# Patient Record
Sex: Female | Born: 1965 | Race: White | Hispanic: No | Marital: Married | Smoking: Former smoker
Health system: Southern US, Community
[De-identification: ages and names within clinical notes are randomized; demographics above are authoritative.]

## PROBLEM LIST (undated history)

## (undated) DIAGNOSIS — C801 Malignant (primary) neoplasm, unspecified: Secondary | ICD-10-CM

---

## 2005-01-16 ENCOUNTER — Observation Stay: Payer: Self-pay | Admitting: Unknown Physician Specialty

## 2005-03-24 ENCOUNTER — Inpatient Hospital Stay: Payer: Self-pay | Admitting: Obstetrics & Gynecology

## 2005-06-08 ENCOUNTER — Ambulatory Visit: Payer: Self-pay | Admitting: Obstetrics & Gynecology

## 2009-06-14 ENCOUNTER — Ambulatory Visit: Payer: Self-pay

## 2011-12-07 ENCOUNTER — Ambulatory Visit: Payer: Self-pay | Admitting: Obstetrics and Gynecology

## 2012-02-08 ENCOUNTER — Ambulatory Visit: Payer: Self-pay | Admitting: Obstetrics and Gynecology

## 2012-02-08 LAB — COMPREHENSIVE METABOLIC PANEL
Alkaline Phosphatase: 60 U/L (ref 50–136)
Anion Gap: 8 (ref 7–16)
Bilirubin,Total: 0.4 mg/dL (ref 0.2–1.0)
Chloride: 108 mmol/L — ABNORMAL HIGH (ref 98–107)
Co2: 27 mmol/L (ref 21–32)
Creatinine: 0.85 mg/dL (ref 0.60–1.30)
EGFR (African American): >60
EGFR (Non-African Amer.): >60
Glucose: 74 mg/dL (ref 65–99)
Osmolality: 284 (ref 275–301)
SGPT (ALT): 21 U/L

## 2012-02-08 LAB — CBC
MCH: 29.2 pg (ref 26.0–34.0)
MCV: 86 fL (ref 80–100)
Platelet: 295 10*3/uL (ref 150–440)
RBC: 4.12 10*6/uL (ref 3.80–5.20)
RDW: 13.7 % (ref 11.5–14.5)
WBC: 6.3 10*3/uL (ref 3.6–11.0)

## 2012-02-13 ENCOUNTER — Ambulatory Visit: Payer: Self-pay | Admitting: Obstetrics and Gynecology

## 2012-02-13 HISTORY — PX: TOTAL LAPAROSCOPIC HYSTERECTOMY WITH SALPINGECTOMY: SHX6742

## 2012-02-13 LAB — PREGNANCY, URINE: Pregnancy Test, Urine: NEGATIVE m[IU]/mL

## 2012-02-16 LAB — PATHOLOGY REPORT

## 2015-03-28 NOTE — Op Note (Signed)
PATIENT NAME:  Denise [name] MR#:  [id number] DATE OF BIRTH:  09/09/1966  DATE OF PROCEDURE:  02/13/2012  PREOPERATIVE DIAGNOSES:  1. Menorrhagia.  2. Severe dysmenorrhea.  3. Suspected adenomyosis.   POSTOPERATIVE DIAGNOSES:  1. Menorrhagia.  2. Severe dysmenorrhea.  3. Suspected adenomyosis.   PROCEDURES:  1. Total laparoscopic hysterectomy.  2. Bilateral salpingectomy.  3. Video cystourethroscopy.   SURGEON: Stephen Jackson, MD   ASSISTANT: Paul Harris, MD   ANESTHESIA: General.  ESTIMATED BLOOD LOSS: 50 mL.  OPERATIVE FLUIDS: 1900 mL crystalloid.   COMPLICATIONS: None.   FINDINGS:  1. Slightly enlarged uterus, otherwise appears normal.  2. Normal-appearing ovaries.  3. Two tubal occlusion clips on each fallopian tube.   SPECIMENS: Right and left fallopian tubes, uterus and cervix.   CONDITION: Stable.   INDICATIONS FOR PROCEDURE: Denise [name] is a 49-year-old female who presented to my office for severe menorrhagia as well as dysmenorrhea. In working her issues up, there was suggestion of adenomyosis on her ultrasound. Given this finding and the different recommended treatment options for this, she elected to undergo a total hysterectomy via a laparoscopic approach. She was, therefore, taken to the Operating Room.   PROCEDURE IN DETAIL: After the patient was met in the preoperative area, and the procedure was reviewed and questions were answered, she was taken to the Operating Room. She was placed under general anesthesia, which was found to be adequate. She was placed in the dorsal supine lithotomy position in the Allen stirrups. She was then prepped and draped in the usual sterile fashion.    After a timeout was called, a Foley catheter was placed in the bladder; and a VCare uterine manipulator was placed in the uterus for uterine manipulation.   Attention was turned to the abdomen where a 5 mm infraumbilical incision was made after injection of local anesthetic. The  abdomen was then entered using the Optiview trocar, and abdominal entry was verified via opening pressure. After insufflation of the abdomen with CO2, the camera was reintroduced with a trocar and atraumatic entry was verified. After injection of local anesthetic, a 5 mm left lower quadrant port was placed under direct intra-abdominal observation, and an 11 mm right lower quadrant port was placed in the same manner. A survey of the abdomen was undertaken which revealed the above-noted findings. Attention was turned to the right fallopian tube which was freed along the mesosalpinx to a point where it could be included in the uterine specimen. This was done because the location of the two tubal occlusion clips that were placed there in the patient's surgical past for contraception did not permit the usual dissection of the fallopian tube. At this point the round ligaments, followed by the broad ligaments, were then transected; and the uterine arteries were skeletonized and then cauterized using bipolar electrocautery, then transected using the Harmonic scalpel. A bladder flap was then created. Attention was then returned to the left side which, in a similar fashion, had the fallopian tube dissected along the mesosalpinx from lateral to medial given the location of the tubal occlusion clips. In a similar fashion, the round ligament and broad ligament were transected, uterine arteries were skeletonized and then bipolar coagulated using the Kleppinger device, and transected using the Harmonic scalpel. A bladder flap was created such that the co-ring was well above the level of the bladder flap. The colpotomy was then made using the Harmonic in a circumferential fashion around the cervix following the indentation in the co-ring. Once   this was accomplished, the uterus was free from any abdominal or pelvic attachments. It was then removed from the vagina without incident.   After the vagina was occluded to maintain  pneumoperitoneum, the vaginal cuff was closed using #0 Vicryl in interrupted fashion. The integrity of the closure was verified digitally to ensure that there were no large gaps nor could air escape from the pneumoperitoneum. Once this was accomplished, attention was then turned to cystoscopy.   The Foley bulb was removed, and after venous injection of indigo carmine by anesthesia the cystoscope was introduced gently through the urethra into the bladder, which was then distended using 200 mL of normal saline. There were no defects in the bladder appreciated, and bilateral efflux from the ureteral orifices was noted of blue effluent.   After the bladder was drained and the Foley was replaced, attention was then returned to the abdomen where all vascular pedicles at this point were re-examined under laparoscopy with hemostasis noted. The abdomen was desufflated and all port sites were removed. At each port site the subcutaneous tissue was reapproximated using a 3-0 Monocryl in a single interrupted stitch. The skin was reapproximated using Dermabond.   The patient tolerated the procedure well. Sponge, lap, and needle counts were correct x2. The patient was given antibiotic prophylaxis in the form of Ancef 2 grams prior to the beginning of the procedure. For deep vein thrombosis prophylaxis, the patient had pneumatic compression stockings placed prior to the beginning of the procedure, and they remained on throughout the entire procedure. The patient was awakened in the Operating Room and taken to the PACU in stable condition.   ____________________________ Stephen D. Jackson, MD sdj:cbb D: 02/13/2012 18:35:23 ET T: 02/14/2012 11:25:52 ET JOB#: [id number]  cc: Stephen D. Jackson, MD, <Dictator> STEPHEN D JACKSON MD ELECTRONICALLY SIGNED 02/25/2012 23:18 

## 2015-06-17 ENCOUNTER — Other Ambulatory Visit: Payer: Self-pay | Admitting: Obstetrics and Gynecology

## 2015-06-17 DIAGNOSIS — Z1231 Encounter for screening mammogram for malignant neoplasm of breast: Secondary | ICD-10-CM

## 2015-06-23 ENCOUNTER — Ambulatory Visit
Admission: RE | Admit: 2015-06-23 | Discharge: 2015-06-23 | Disposition: A | Discharge status: Home / Self Care | Payer: BC Managed Care – PPO | Source: Ambulatory Visit | Attending: Obstetrics and Gynecology | Admitting: Obstetrics and Gynecology

## 2015-06-23 DIAGNOSIS — R922 Inconclusive mammogram: Secondary | ICD-10-CM | POA: Diagnosis not present

## 2015-06-23 DIAGNOSIS — Z1231 Encounter for screening mammogram for malignant neoplasm of breast: Secondary | ICD-10-CM | POA: Diagnosis present

## 2015-06-23 HISTORY — DX: Malignant (primary) neoplasm, unspecified: C80.1

## 2015-06-25 ENCOUNTER — Other Ambulatory Visit: Payer: Self-pay | Admitting: Obstetrics and Gynecology

## 2015-06-25 DIAGNOSIS — R928 Other abnormal and inconclusive findings on diagnostic imaging of breast: Secondary | ICD-10-CM

## 2015-06-25 DIAGNOSIS — N6320 Unspecified lump in the left breast, unspecified quadrant: Secondary | ICD-10-CM

## 2015-07-02 ENCOUNTER — Ambulatory Visit
Admission: RE | Admit: 2015-07-02 | Discharge: 2015-07-02 | Disposition: A | Discharge status: Home / Self Care | Payer: BC Managed Care – PPO | Source: Ambulatory Visit | Attending: Obstetrics and Gynecology | Admitting: Obstetrics and Gynecology

## 2015-07-02 DIAGNOSIS — N6320 Unspecified lump in the left breast, unspecified quadrant: Secondary | ICD-10-CM

## 2015-07-02 DIAGNOSIS — R928 Other abnormal and inconclusive findings on diagnostic imaging of breast: Secondary | ICD-10-CM

## 2016-02-13 IMAGING — MG MM DIAG BREAST TOMO UNI LEFT
6 series · 6 of 14 positions shown · non-contrast
Comparison: Previous exam(s).

CLINICAL DATA: Left slightly lower inner quadrant focal asymmetry
seen on most recent screening mammography.

EXAM:
DIGITAL DIAGNOSTIC LEFT MAMMOGRAM WITH 3D TOMOSYNTHESIS WITH CAD
ULTRASOUND LEFT BREAST

[L MLO]
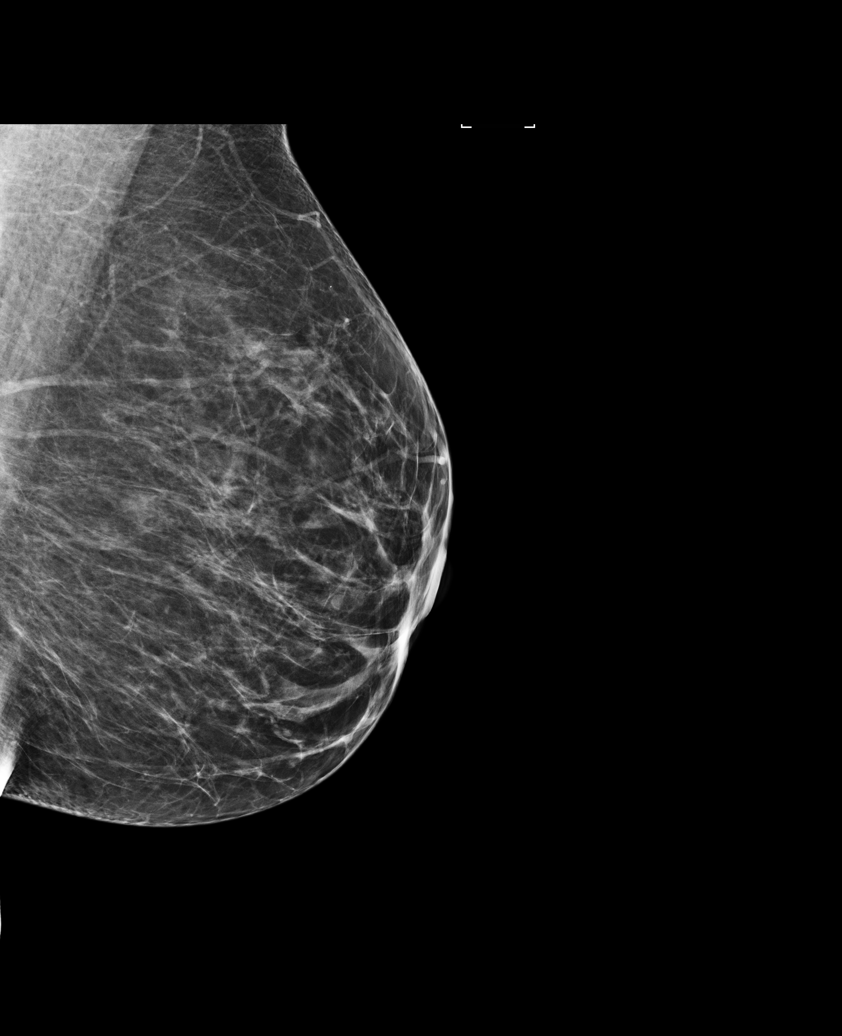

[L CC synth-2D]
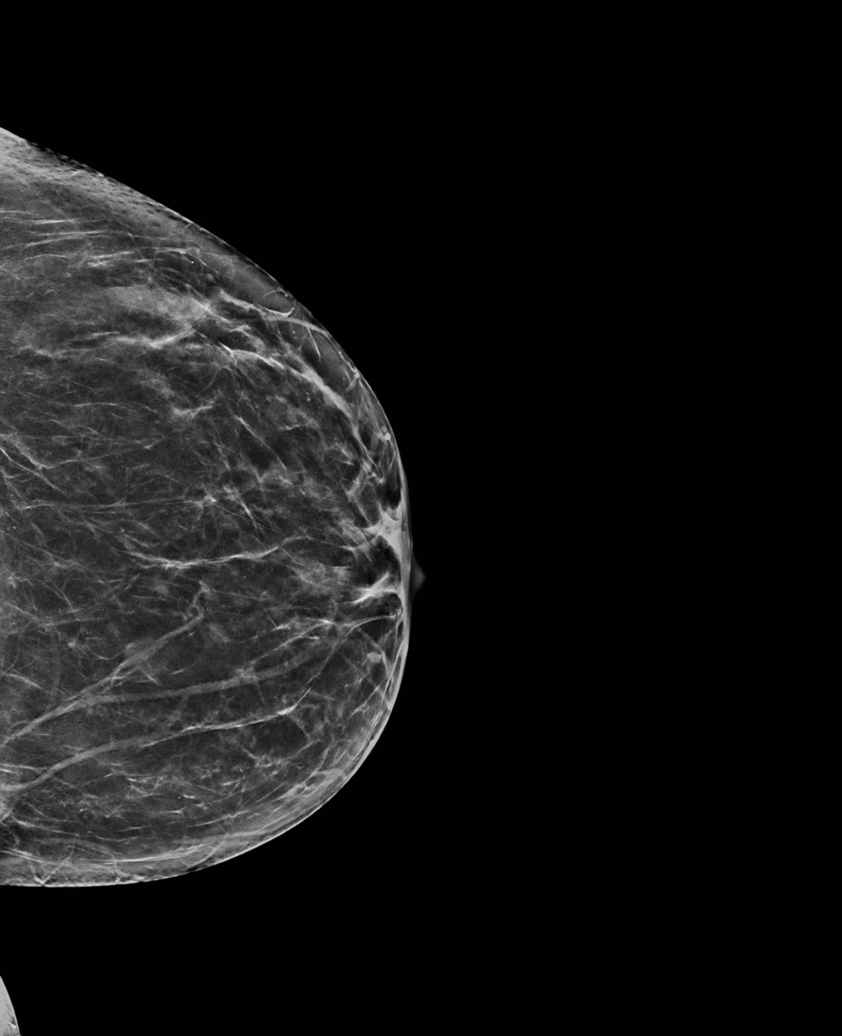

[L MLO synth-2D]
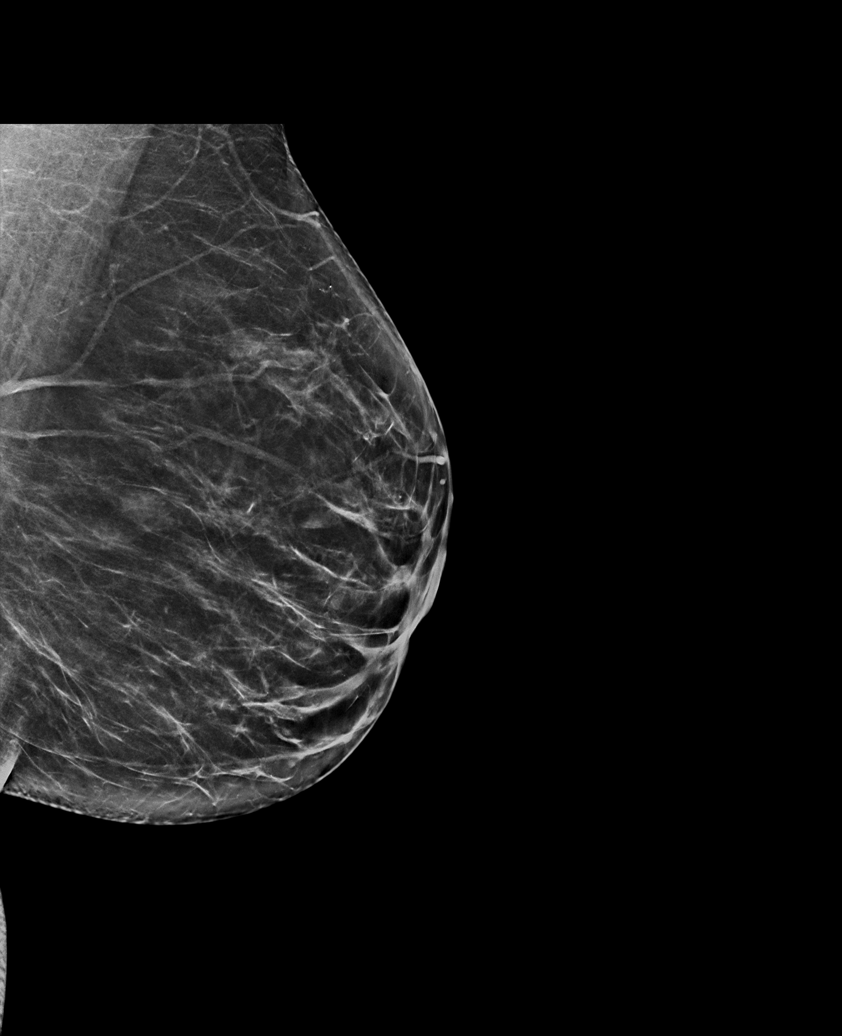

[L CC]
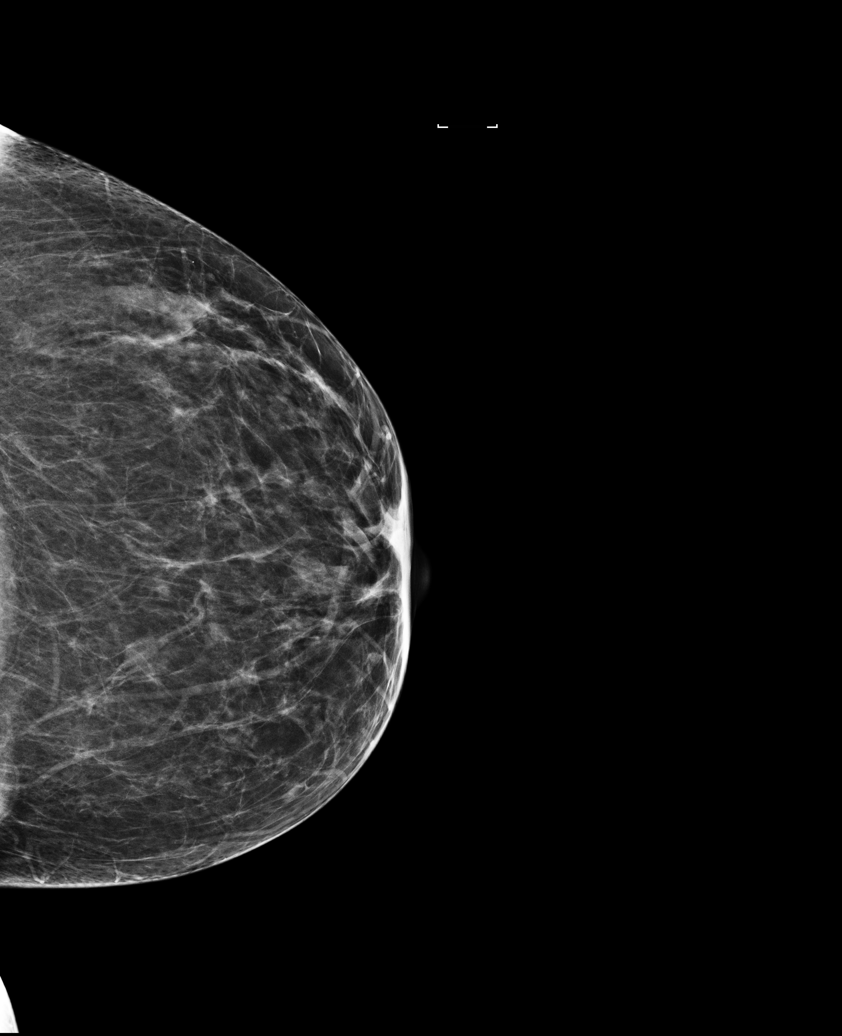

[L MLO tomo · tomo slice 36/71.0]
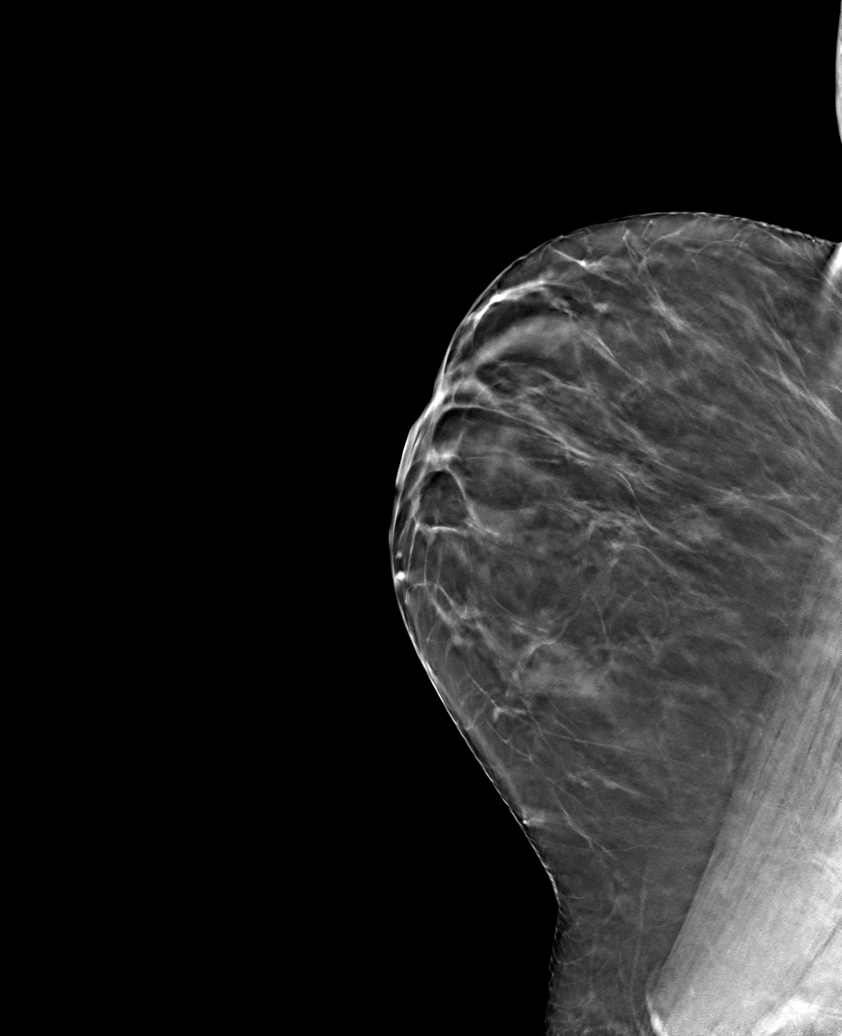

[L CC tomo · tomo slice 33/64.0]
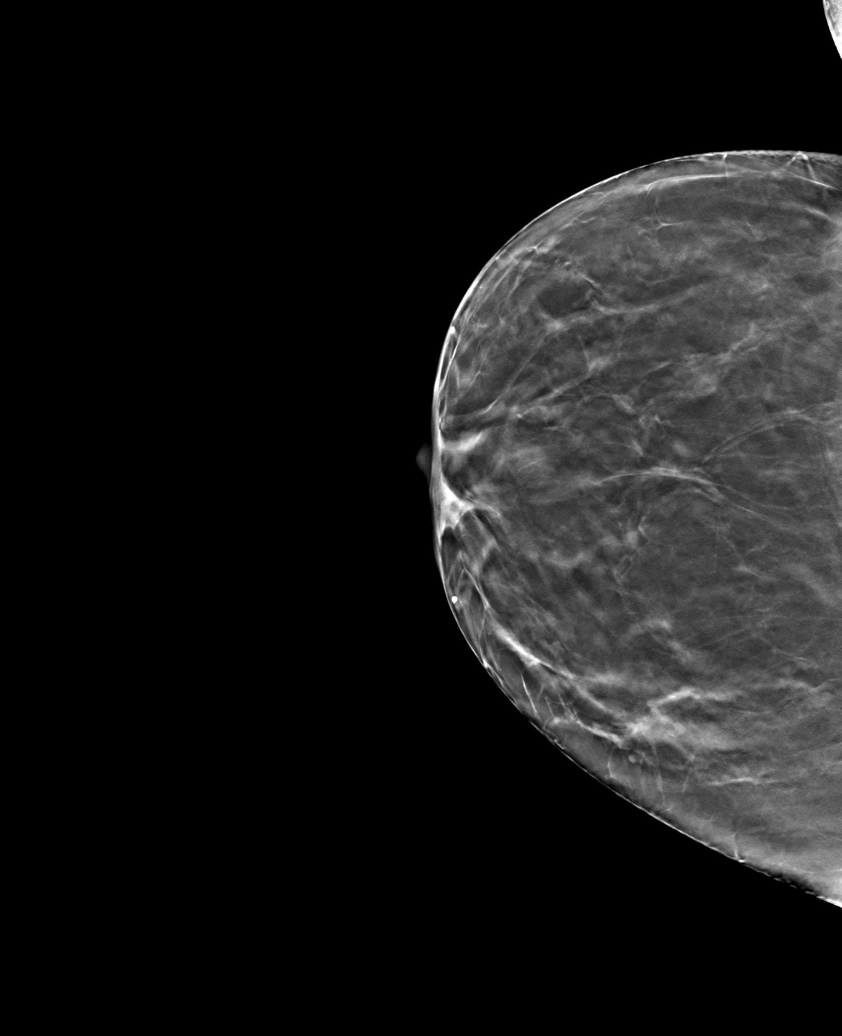

[6 of 14 positions shown; findings below may reference images not displayed]

ACR Breast Density Category b: There are scattered areas of
fibroglandular density.
FINDINGS: There are no suspicious masses, areas of architectural distortion or
microcalcifications in either breast. The previously seen asymmetry
in the central inner left breast, middle depth, effaces to glandular
tissue on additional imaging and has appearance stable from the 5678
mammogram.

Mammographic images were processed with CAD.

On physical exam, no suspicious masses of found.

Targeted ultrasound is performed, showing no suspicious masses or
shadowing lesions.
IMPRESSION: No mammographic or sonographic evidence of malignancy in the left
breast.

RECOMMENDATION:
Screening mammogram in one year.(Code:LS-[id number])

I have discussed the findings and recommendations with the patient.
Results were also provided in writing at the conclusion of the
visit. If applicable, a reminder letter will be sent to the patient
regarding the next appointment.

BI-RADS CATEGORY  2: Benign.

## 2017-02-15 ENCOUNTER — Telehealth: Payer: Self-pay

## 2017-02-15 NOTE — Telephone Encounter (Signed)
Pt calling for refill of Xanax or something similar. Pt would like to discuss w/SDJ.

## 2017-08-21 ENCOUNTER — Telehealth: Payer: Self-pay | Admitting: Obstetrics and Gynecology

## 2017-08-21 ENCOUNTER — Other Ambulatory Visit: Payer: Self-pay

## 2017-08-21 NOTE — Telephone Encounter (Signed)
Refill verbally called in . Pt awre via vm

## 2017-08-21 NOTE — Telephone Encounter (Signed)
Patient needs refill on estrogen patch, annual is scheduled 10/9 but does not have enough to make to that point.  [facility] [place].

## 2017-09-11 ENCOUNTER — Ambulatory Visit (INDEPENDENT_AMBULATORY_CARE_PROVIDER_SITE_OTHER): Payer: BC Managed Care – PPO | Admitting: Obstetrics and Gynecology

## 2017-09-11 ENCOUNTER — Encounter: Payer: Self-pay | Admitting: Obstetrics and Gynecology

## 2017-09-11 VITALS — BP 124/78 | Ht 63.0 in | Wt 151.0 lb

## 2017-09-11 DIAGNOSIS — Z1239 Encounter for other screening for malignant neoplasm of breast: Secondary | ICD-10-CM

## 2017-09-11 DIAGNOSIS — Z1231 Encounter for screening mammogram for malignant neoplasm of breast: Secondary | ICD-10-CM | POA: Diagnosis not present

## 2017-09-11 DIAGNOSIS — Z1339 Encounter for screening examination for other mental health and behavioral disorders: Secondary | ICD-10-CM | POA: Diagnosis not present

## 2017-09-11 DIAGNOSIS — N951 Menopausal and female climacteric states: Secondary | ICD-10-CM | POA: Diagnosis not present

## 2017-09-11 DIAGNOSIS — Z01419 Encounter for gynecological examination (general) (routine) without abnormal findings: Secondary | ICD-10-CM

## 2017-09-11 DIAGNOSIS — Z1331 Encounter for screening for depression: Secondary | ICD-10-CM

## 2017-09-11 NOTE — Progress Notes (Signed)
Gynecology Annual Exam  PCP: Will Bonnet, MD  Chief Complaint  Patient presents with  . Annual Exam    History of Present Illness:  Ms. NAYELIS BONITO is a 51 y.o. [phone] established patient who LMP was No LMP recorded. Patient has had a hysterectomy., presents today for her annual examination.  Her menses are absent as she is status post hysterectomy.   She does not have vasomotor sx.  She is single partner, contraception - status post hysterectomy. She does not have vaginal dryness.  Last Pap: 2012  Results were: no abnormalities /neg HPV DNA.  Hx of STDs: none  Last mammogram: 2 years ago  Results were: BiRads 2 There is no FH of breast cancer. There is no FH of ovarian cancer. The patient does do self-breast exams. She states that she feels a fullness in the inner left breast compared to her right. She has not noted a specific lump or bump. She also denies skin changes and pain.  She has no nipple retraction or discharge.    Colonoscopy: note done DEXA: has not been screened for osteoporosis  Tobacco use: She is going through some severe life stressors now and increased her smoking and is now decreasing. Alcohol use: She has a history of borderline drinking habbits. She has increased her drinking slightly recently due to very stressful life events.  We continue to discuss using alcohol the way she does.   Exercise: not active  The patient wears seatbelts: yes.     She continues to use her estrogen patches with great benefit. She is having no side effects from the patches and no skin irritation.   Past Medical History:  Diagnosis Date  . Cancer ([place])    basal cell    Past Surgical History:  Procedure Laterality Date  . CESAREAN SECTION    . TOTAL LAPAROSCOPIC HYSTERECTOMY WITH SALPINGECTOMY  02/13/2012    Prior to Admission medications   Medication Sig Start Date End Date Taking? Authorizing Provider  estradiol (VIVELLE-DOT) 0.0375 MG/24HR APPLY 1 PATCH  TWICE WEEKLY 09/01/17  Yes [provider]  Fexofenadine HCl (ALLEGRA ALLERGY PO) Take by mouth.   Yes [provider]   Allergies: No Known Allergies  Obstetric History: [id number]  Family History  Problem Relation Age of Onset  . Hypertension Mother   . Multiple myeloma Father   . Hypertension Sister   . Lupus Brother   . Osteoporosis Brother   . Heart attack Maternal Uncle   . Heart disease Maternal Grandfather   . Heart disease Paternal Grandfather     Social History   Social History  . Marital status: Married    Spouse name: N/A  . Number of children: N/A  . Years of education: N/A   Occupational History  . Not on file.   Social History Main Topics  . Smoking status: Current Every Day Smoker  . Smokeless tobacco: Never Used  . Alcohol use Yes  . Drug use: No  . Sexual activity: Yes    Birth control/ protection: Surgical   Other Topics Concern  . Not on file   Social History Narrative  . No narrative on file    Review of Systems  Constitutional: Negative.   HENT: Negative.   Eyes: Negative.   Respiratory: Negative.   Cardiovascular: Negative.   Gastrointestinal: Negative.   Genitourinary: Negative.   Musculoskeletal: Negative.   Skin: Negative.   Neurological: Negative.   Psychiatric/Behavioral: Negative.  Physical Exam BP 124/78   Ht 5' 3"  (1.6 m)   Wt 151 lb (68.5 kg)   BMI 26.75 kg/m   Physical Exam  Constitutional: She is oriented to person, place, and time. She appears well-developed and well-nourished. No distress.  Genitourinary: Pelvic exam was performed with patient supine. There is no rash, tenderness or lesion on the right labia. There is no rash, tenderness or lesion on the left labia. Vagina exhibits no lesion. No erythema, tenderness or bleeding in the vagina. No signs of injury around the vagina. No vaginal discharge found. Right adnexum does not display mass, does not display tenderness and does not display  fullness. Left adnexum does not display mass, does not display tenderness and does not display fullness.  Genitourinary Comments: Uterus and cervix surgically absent  HENT:  Head: Normocephalic and atraumatic.  Eyes: EOM are normal. No scleral icterus.  Neck: Normal range of motion. Neck supple. No thyromegaly present.  Cardiovascular: Normal rate and regular rhythm.  Exam reveals no gallop and no friction rub.   No murmur heard. Pulmonary/Chest: Effort normal and breath sounds normal. No respiratory distress. She has no wheezes. She has no rales. Right breast exhibits no inverted nipple, no mass, no nipple discharge, no skin change and no tenderness. Left breast exhibits no inverted nipple, no mass, no nipple discharge, no skin change and no tenderness.  No masses appreciated in area of "fullness." both a seated and supine exam were performed.    Abdominal: Soft. Bowel sounds are normal. She exhibits no distension and no mass. There is no tenderness. There is no rebound and no guarding.  Musculoskeletal: Normal range of motion. She exhibits no edema.  Lymphadenopathy:    She has no cervical adenopathy.  Neurological: She is alert and oriented to person, place, and time. No cranial nerve deficit.  Skin: Skin is warm and dry. No erythema.  Psychiatric: She has a normal mood and affect. Her behavior is normal. Judgment normal.   Female chaperone present for pelvic and breast  portions of the physical exam  Results: AUDIT Questionnaire (screen for alcoholism): 9 PHQ-9: 11    Assessment: 51 y.o. [id number] female here for routine gynecologic examination.  Plan: Problem List Items Addressed This Visit    None    Visit Diagnoses    Women's annual routine gynecological examination    -  Primary   Relevant Orders   MM DIGITAL SCREENING BILATERAL   Screening for depression       Screening for alcohol problem       Screening for breast cancer       Relevant Orders   MM DIGITAL SCREENING  BILATERAL   Climacteric       Relevant Medications   estradiol (VIVELLE-DOT) 0.0375 MG/24HR (Start on 09/13/2017)      Screening: -- Blood pressure screen normal -- Colonoscopy - not due -- Mammogram - due. Patient to call [facility] to arrange. She understands that it is her responsibility to arrange this. no evidence of mass today. But, she is due for her mammogram and understands the importance of screening. -- Weight screening: normal -- Depression screening negative (PHQ-9) -- Nutrition: normal -- cholesterol screening: not due for screening -- osteoporosis screening: not due -- tobacco screening: Long discussion regarding quitting smoking. Discussed her readiness using the 5 As. She is ready, but unsure how to go about it. She has cut down to about 3 cigarettes per day now.  She does not want to use the  patch or Chantix.  Discusse bupropion, which she will consider. Discussed not giving her HRT while she is smoking.   -- alcohol screening: AUDIT questionnaire indicates low-risk usage. -- family history of breast cancer screening: done. not at high risk. -- no evidence of domestic violence or intimate partner violence. -- STD screening: gonorrhea/chlamydia NAAT not collected per patient request. -- pap smear not collected per ASCCP guidelines -- flu vaccine declines -- HPV vaccination series: not eligilbe   Climacteric: Will give one more month worth of patches to her at this time. Discussed that risk for adverse events, including stroke, are too high while she is smoking. If she quits, then she can resume the estrogen therapy.  She voiced understanding of this.  Prentice Docker, MD 09/12/2017 5:14 PM

## 2017-09-12 MED ORDER — ESTRADIOL 0.0375 MG/24HR TD PTTW: 1.0000 | MEDICATED_PATCH | TRANSDERMAL | 0 refills | Status: DC

## 2020-04-28 ENCOUNTER — Ambulatory Visit (INDEPENDENT_AMBULATORY_CARE_PROVIDER_SITE_OTHER): Payer: BC Managed Care – PPO

## 2020-04-28 ENCOUNTER — Other Ambulatory Visit: Payer: Self-pay

## 2020-04-28 ENCOUNTER — Ambulatory Visit
Admission: EM | Admit: 2020-04-28 | Discharge: 2020-04-28 | Disposition: A | Discharge status: Home / Self Care | Payer: BC Managed Care – PPO | Attending: Family Medicine | Admitting: Family Medicine

## 2020-04-28 DIAGNOSIS — R42 Dizziness and giddiness: Secondary | ICD-10-CM

## 2020-04-28 MED ORDER — MECLIZINE HCL 25 MG PO TABS: 25.0000 mg | ORAL_TABLET | Freq: Three times a day (TID) | ORAL | 0 refills | Status: AC | PRN

## 2020-04-28 NOTE — ED Triage Notes (Signed)
Patient states that she has noticed some dizziness that started on Monday. States that improved after eating. States that this morning she had another dizzy spell while driving. Patient states that she is under a lot of stress. States that she has been having some sinus issues and she thinks she may have something going on with her inner ear. Patient states that at night she has noticed some left sided chest pain and left back pain. States that she vapes currently and is concerned she may have "pneumonia", reports that she looked it up on [employer] and said she may have pneumonia that caused an ear infection. States that she is under a great deal of stress secondary to teenage son and issues he is having.

## 2020-04-28 NOTE — Discharge Instructions (Signed)
Medication as directed.  No evidence of pneumonia.   Stress likely playing a role as well.   Take care  Dr. Lacinda Axon

## 2020-04-28 NOTE — ED Provider Notes (Signed)
[facility]    CSN: [id number] Arrival date & time: 04/28/20  1243  History   Chief Complaint Chief Complaint  Patient presents with  . Dizziness   HPI  54 year old female presents with multiple complaints.  Patient reports ongoing dizziness over the past 2 to 3 days.  Feels off balance.  Seems to be worse with abrupt movements and taking deep breaths.  She states it is also worse when she sings.  Patient also reports that she has had some right ear discomfort.  She states that she suffers from allergies.  Patient also notes that she has had some ongoing pain underneath her left breast which radiates to her back.  She states that she has went online and is concerned that she has walking pneumonia.  She has no cough.  She has no respiratory symptoms.  No fever.  Patient states that she is under a great deal of stress.  No other associated symptoms.  No other complaints.  Past Medical History:  Diagnosis Date  . Cancer ([place])    basal cell   Past Surgical History:  Procedure Laterality Date  . CESAREAN SECTION    . TOTAL LAPAROSCOPIC HYSTERECTOMY WITH SALPINGECTOMY  02/13/2012   OB History    Gravida  2   Para  2   Term  2   Preterm      AB      Living  2     SAB      TAB      Ectopic      Multiple      Live Births  2          Home Medications    Prior to Admission medications   Medication Sig Start Date End Date Taking? Authorizing Provider  Fexofenadine HCl (ALLEGRA ALLERGY PO) Take by mouth.   Yes [provider]  meclizine (ANTIVERT) 25 MG tablet Take 1 tablet (25 mg total) by mouth 3 (three) times daily as needed for dizziness. 04/28/20   Coral Spikes, DO  estradiol (VIVELLE-DOT) 0.0375 MG/24HR Place 1 patch onto the skin 2 (two) times a week. 09/13/17 04/28/20  Will Bonnet, MD    Family History Family History  Problem Relation Age of Onset  . Hypertension Mother   . Multiple myeloma Father   . Hypertension Sister   .  Lupus Brother   . Osteoporosis Brother   . Heart attack Maternal Uncle   . Heart disease Maternal Grandfather   . Heart disease Paternal Grandfather     Social History Social History   Tobacco Use  . Smoking status: Former Smoker    Types: Cigarettes  . Smokeless tobacco: Never Used  Substance Use Topics  . Alcohol use: Yes    Comment: occasionally  . Drug use: No     Allergies   Patient has no known allergies.   Review of Systems Review of Systems  Constitutional: Negative for fever.  Cardiovascular: Positive for chest pain.  Musculoskeletal: Positive for back pain.  Neurological: Positive for dizziness.   Physical Exam Triage Vital Signs ED Triage Vitals  Enc Vitals Group     BP --      Pulse Rate 04/28/20 1304 63     Resp 04/28/20 1304 18     Temp 04/28/20 1304 98.1 F (36.7 C)     Temp Source 04/28/20 1304 Oral     SpO2 04/28/20 1304 100 %     Weight 04/28/20 1302 150 lb (  68 kg)     Height 04/28/20 1302 5' 4"  (1.626 m)     Head Circumference --      Peak Flow --      Pain Score 04/28/20 1302 3     Pain Loc --      Pain Edu? --      Excl. in [place]? --    Updated Vital Signs Pulse 63   Temp 98.1 F (36.7 C) (Oral)   Resp 18   Ht 5' 4"  (1.626 m)   Wt 68 kg   SpO2 100%   BMI 25.75 kg/m   Visual Acuity Right Eye Distance:   Left Eye Distance:   Bilateral Distance:    Right Eye Near:   Left Eye Near:    Bilateral Near:     Physical Exam Constitutional:      General: She is not in acute distress.    Appearance: Normal appearance. She is not ill-appearing.  HENT:     Head: Normocephalic and atraumatic.  Eyes:     General:        Right eye: No discharge.        Left eye: No discharge.     Extraocular Movements: Extraocular movements intact.     Conjunctiva/sclera: Conjunctivae normal.     Pupils: Pupils are equal, round, and reactive to light.  Cardiovascular:     Rate and Rhythm: Normal rate and regular rhythm.  Pulmonary:     Effort:  Pulmonary effort is normal.     Breath sounds: Normal breath sounds. No wheezing, rhonchi or rales.  Neurological:     Mental Status: She is alert.  Psychiatric:        Mood and Affect: Mood normal.        Behavior: Behavior normal.    UC Treatments / Results  Labs (all labs ordered are listed, but only abnormal results are displayed) Labs Reviewed - No data to display  EKG Interpretation: Normal sinus rhythm at the rate of 63.  Incomplete right bundle branch block.  Normal axis.  No ST-T wave changes.  Unremarkable EKG.  Radiology DG Chest 2 View  Result Date: 04/28/2020 CLINICAL DATA:  Left-sided chest pain.  Shortness of breath. EXAM: CHEST - 2 VIEW COMPARISON:  None. FINDINGS: The cardiomediastinal silhouette is within normal limits. The lungs are well inflated and clear. There is no evidence of pleural effusion or pneumothorax. There is mild S-shaped thoracic scoliosis. IMPRESSION: No active cardiopulmonary disease. Electronically Signed   By: Logan Bores M.D.   On: 04/28/2020 14:10    Procedures Procedures (including critical care time)  Medications Ordered in UC Medications - No data to display  Initial Impression / Assessment and Plan / UC Course  I have reviewed the triage vital signs and the nursing notes.  Pertinent labs & imaging results that were available during my care of the patient were reviewed by me and considered in my medical decision making (see chart for details).    54 year old female presents with multiple complaints.  EKG unremarkable.  Chest x-ray negative.  Dizziness appears to be consistent with vertigo.  Treating with meclizine.  Advised supportive care.  Final Clinical Impressions(s) / UC Diagnoses   Final diagnoses:  Dizziness     Discharge Instructions     Medication as directed.  No evidence of pneumonia.   Stress likely playing a role as well.   Take care  Dr. Lacinda Axon    ED Prescriptions    Medication Sig  Dispense Auth.  Provider   meclizine (ANTIVERT) 25 MG tablet Take 1 tablet (25 mg total) by mouth 3 (three) times daily as needed for dizziness. 30 tablet Coral Spikes, DO     PDMP not reviewed this encounter.   Coral Spikes, [place] 04/28/20 1519

## 2021-05-17 ENCOUNTER — Other Ambulatory Visit: Payer: Self-pay | Admitting: Family Medicine

## 2021-05-17 DIAGNOSIS — Z1231 Encounter for screening mammogram for malignant neoplasm of breast: Secondary | ICD-10-CM

## 2021-06-08 ENCOUNTER — Inpatient Hospital Stay: Admission: RE | Admit: 2021-06-08 | Payer: BC Managed Care – PPO | Source: Ambulatory Visit

## 2022-05-19 ENCOUNTER — Other Ambulatory Visit: Payer: Self-pay | Admitting: Family Medicine

## 2022-05-19 DIAGNOSIS — Z1231 Encounter for screening mammogram for malignant neoplasm of breast: Secondary | ICD-10-CM

## 2022-07-27 ENCOUNTER — Ambulatory Visit: Payer: Self-pay

## 2022-08-01 ENCOUNTER — Ambulatory Visit
Admission: RE | Admit: 2022-08-01 | Discharge: 2022-08-01 | Disposition: A | Discharge status: Home / Self Care | Payer: BC Managed Care – PPO | Source: Ambulatory Visit | Attending: Family Medicine | Admitting: Family Medicine

## 2022-08-01 DIAGNOSIS — Z1231 Encounter for screening mammogram for malignant neoplasm of breast: Secondary | ICD-10-CM | POA: Diagnosis present

## 2023-07-17 ENCOUNTER — Other Ambulatory Visit: Payer: Self-pay

## 2023-07-17 ENCOUNTER — Ambulatory Visit (INDEPENDENT_AMBULATORY_CARE_PROVIDER_SITE_OTHER): Payer: BC Managed Care – PPO

## 2023-07-17 ENCOUNTER — Ambulatory Visit
Admission: EM | Admit: 2023-07-17 | Discharge: 2023-07-17 | Disposition: A | Discharge status: Home / Self Care | Payer: BC Managed Care – PPO | Attending: Physician Assistant | Admitting: Physician Assistant

## 2023-07-17 DIAGNOSIS — M25512 Pain in left shoulder: Secondary | ICD-10-CM

## 2023-07-17 DIAGNOSIS — S4352XA Sprain of left acromioclavicular joint, initial encounter: Secondary | ICD-10-CM | POA: Diagnosis not present

## 2023-07-17 MED ORDER — NAPROXEN 500 MG PO TABS: ORAL_TABLET | ORAL | 0 refills | Status: AC

## 2023-07-17 NOTE — ED Provider Notes (Signed)
[facility]    CSN: [id number] Arrival date & time: 07/17/23  1001      History   Chief Complaint Chief Complaint  Patient presents with   Shoulder Pain   Fall    HPI Denise [name] is a 57 y.o. female presenting for left shoulder pain after an accidental fall last night.  She reports that she fell against a refrigerator.  She hit the left side of her head on her fridge rater and her shoulder on the ground.  She reports increased pain and reduced range of motion upon waking this morning.  It was hurting yesterday but her worse today.  She is not reporting any pain in her neck, back or chest.  Reports tingling sensation running down her arm to the dorsal left hand.  Reports most pain and difficulty with reaching over and touching the opposite shoulder.  No other injuries. Taking tylenol.   HPI  Past Medical History:  Diagnosis Date   Cancer (HCC)    basal cell    There are no problems to display for this patient.   Past Surgical History:  Procedure Laterality Date   CESAREAN SECTION     TOTAL LAPAROSCOPIC HYSTERECTOMY WITH SALPINGECTOMY  02/13/2012    OB History     Gravida  2   Para  2   Term  2   Preterm      AB      Living  2      SAB      IAB      Ectopic      Multiple      Live Births  2            Home Medications    Prior to Admission medications   Medication Sig Start Date End Date Taking? Authorizing Provider  naproxen (NAPROSYN) 500 MG tablet Take 1 tab bid  prn with food for shoulder pain 07/17/23  Yes Eusebio Friendly B, PA-C  Fexofenadine HCl (ALLEGRA ALLERGY PO) Take by mouth.    [provider]  meclizine (ANTIVERT) 25 MG tablet Take 1 tablet (25 mg total) by mouth 3 (three) times daily as needed for dizziness. 04/28/20   Tommie Sams, DO  estradiol (VIVELLE-DOT) 0.0375 MG/24HR Place 1 patch onto the skin 2 (two) times a week. 09/13/17 04/28/20  Conard Novak, MD    Family History Family History   Problem Relation Age of Onset   Hypertension Mother    Multiple myeloma Father    Hypertension Sister    Heart attack Maternal Uncle    Heart disease Maternal Grandfather    Heart disease Paternal Grandfather    Lupus Brother    Osteoporosis Brother    Breast cancer Neg Hx     Social History Social History   Tobacco Use   Smoking status: Former    Types: Cigarettes   Smokeless tobacco: Never  Vaping Use   Vaping status: Every Day  Substance Use Topics   Alcohol use: Yes    Comment: occasionally   Drug use: No     Allergies   Patient has no known allergies.   Review of Systems Review of Systems  Musculoskeletal:  Positive for arthralgias and joint swelling. Negative for back pain, neck pain and neck stiffness.  Skin:  Negative for color change and wound.  Neurological:  Negative for weakness and numbness.     Physical Exam Triage Vital Signs ED Triage Vitals  Encounter Vitals Group  BP 07/17/23 1057 106/75     Systolic BP Percentile --      Diastolic BP Percentile --      Pulse Rate 07/17/23 1057 83     Resp 07/17/23 1057 16     Temp 07/17/23 1057 98.6 F (37 C)     Temp Source 07/17/23 1057 Oral     SpO2 07/17/23 1057 100 %     Weight --      Height --      Head Circumference --      Peak Flow --      Pain Score 07/17/23 1054 2     Pain Loc --      Pain Education --      Exclude from Growth Chart --    No data found.  Updated Vital Signs BP 106/75 (BP Location: Right Arm)   Pulse 83   Temp 98.6 F (37 C) (Oral)   Resp 16   SpO2 100%     Physical Exam Vitals and nursing note reviewed.  Constitutional:      General: She is not in acute distress.    Appearance: Normal appearance. She is not ill-appearing or toxic-appearing.  HENT:     Head: Normocephalic and atraumatic.  Eyes:     General: No scleral icterus.       Right eye: No discharge.        Left eye: No discharge.     Conjunctiva/sclera: Conjunctivae normal.   Cardiovascular:     Rate and Rhythm: Normal rate and regular rhythm.     Heart sounds: Normal heart sounds.  Pulmonary:     Effort: Pulmonary effort is normal. No respiratory distress.     Breath sounds: Normal breath sounds.  Musculoskeletal:     Left shoulder: Swelling (over [facility] joint, distal clavicle) and bony tenderness (AC joint, distal clavicle, lateral shoulder) present. Decreased range of motion (90 degrees flexion, other range of motion normal). Normal pulse.     Cervical back: Neck supple.     Comments: Unable to reach over and touch opposite shoulder  Skin:    General: Skin is dry.  Neurological:     General: No focal deficit present.     Mental Status: She is alert and oriented to person, place, and time. Mental status is at baseline.     Cranial Nerves: No cranial nerve deficit (grossly intact).     Motor: No weakness.     Gait: Gait normal.  Psychiatric:        Mood and Affect: Mood normal.        Behavior: Behavior normal.        Thought Content: Thought content normal.      UC Treatments / Results  Labs (all labs ordered are listed, but only abnormal results are displayed) Labs Reviewed - No data to display  EKG   Radiology DG Shoulder Left  Result Date: 07/17/2023 CLINICAL DATA:  Fall yesterday, pain EXAM: LEFT SHOULDER - 2+ VIEW COMPARISON:  None Available. FINDINGS: There is no evidence of fracture or dislocation. There is no evidence of arthropathy or other focal bone abnormality. Soft tissues are unremarkable. IMPRESSION: No fracture or dislocation of the left shoulder. Joint spaces are preserved. Electronically Signed   By: Jearld Lesch M.D.   On: 07/17/2023 12:11    Procedures Procedures (including critical care time)  Medications Ordered in UC Medications - No data to display  Initial Impression / Assessment and Plan / UC Course  I have reviewed the triage vital signs and the nursing notes.  Pertinent labs & imaging results that were available  during my care of the patient were reviewed by me and considered in my medical decision making (see chart for details).   57 year old female presents for left shoulder pain after accidental fall yesterday.  Shoulder hit the ground.  On exam she has mild swelling over the [facility] joint.  Tenderness over the [facility] joint and distal clavicle.  Reduced shoulder flexion to 90 degrees, otherwise normal range of motion.  Unable to reach over and touch the opposite shoulder.  X-ray obtained to assess for potential fracture, AC joint separation.  X-ray without any acute abnormality.  Discussed this with patient.  Suspect likely [facility] joint sprain  Patient given sling for comfort.  Sent naproxen to pharmacy.  Advise she can also take Tylenol.  Reviewed following up with Ortho if no improvement in the next 1 to 2 weeks or symptoms worsen.  If she is not regaining pain range of motion may need MRI.   Final Clinical Impressions(s) / UC Diagnoses   Final diagnoses:  Sprain of acromioclavicular joint, left, initial encounter  Acute pain of left shoulder     Discharge Instructions      SPRAIN: Stressed avoiding painful activities . Reviewed RICE guidelines. Use medications as directed, including NSAIDs. If no NSAIDs have been prescribed for you today, you may take Aleve or Motrin over the counter. May use Tylenol in between doses of NSAIDs.  If no improvement in the next 1-2 weeks, f/u with PCP or orthopedics to rule out ligament/tendon tears.   You may have a condition requiring you to follow up with Orthopedics so please call one of the following office for appointment:   [facility] Address: [address], [place], [place] [address] Phone: [phone]  [facility] [address], [place], [place] [address] Phone: [phone]  [facility] [address], [place], [place] [address] Phone: [phone]      ED Prescriptions     Medication Sig Dispense Auth. Provider   naproxen (NAPROSYN) 500 MG  tablet Take 1 tab bid  prn with food for shoulder pain 30 tablet Shirlee Latch, PA-C      PDMP not reviewed this encounter.   Shirlee Latch, PA-C 07/17/23 1226

## 2023-07-17 NOTE — Discharge Instructions (Signed)
SPRAIN: Stressed avoiding painful activities . Reviewed RICE guidelines. Use medications as directed, including NSAIDs. If no NSAIDs have been prescribed for you today, you may take Aleve or Motrin over the counter. May use Tylenol in between doses of NSAIDs.  If no improvement in the next 1-2 weeks, f/u with PCP or orthopedics to rule out ligament/tendon tears.   You may have a condition requiring you to follow up with Orthopedics so please call one of the following office for appointment:   [facility] Address: [address], [place], [place] [address] Phone: [phone]  [facility] [address] [place], [place] [address] Phone: [phone]  [facility] [address], [place], [place] [address] Phone: [phone]

## 2023-07-17 NOTE — ED Triage Notes (Signed)
Pt is here with left shoulder pain that started yesterday after falling down. Pt has taken Tylenol(7am) to relieve discomfort.

## 2024-11-05 ENCOUNTER — Other Ambulatory Visit: Payer: Self-pay | Admitting: Family Medicine

## 2024-11-05 DIAGNOSIS — Z1231 Encounter for screening mammogram for malignant neoplasm of breast: Secondary | ICD-10-CM
